# Patient Record
Sex: Male | Born: 1995 | Race: Asian | Hispanic: Yes | Marital: Single
Health system: Southern US, Community
[De-identification: ages and names within clinical notes are randomized; demographics above are authoritative.]

---

## 2018-08-21 ENCOUNTER — Emergency Department (HOSPITAL_COMMUNITY)
Admission: EM | Admit: 2018-08-21 | Discharge: 2018-08-21 | Disposition: A | Discharge status: Home / Self Care | Payer: BLUE CROSS/BLUE SHIELD | Attending: Emergency Medicine | Admitting: Emergency Medicine

## 2018-08-21 ENCOUNTER — Emergency Department (HOSPITAL_COMMUNITY): Payer: BLUE CROSS/BLUE SHIELD

## 2018-08-21 ENCOUNTER — Other Ambulatory Visit: Payer: Self-pay

## 2018-08-21 ENCOUNTER — Encounter (HOSPITAL_COMMUNITY): Payer: Self-pay | Admitting: Emergency Medicine

## 2018-08-21 DIAGNOSIS — Y9389 Activity, other specified: Secondary | ICD-10-CM | POA: Insufficient documentation

## 2018-08-21 DIAGNOSIS — Z23 Encounter for immunization: Secondary | ICD-10-CM | POA: Insufficient documentation

## 2018-08-21 DIAGNOSIS — S60552A Superficial foreign body of left hand, initial encounter: Secondary | ICD-10-CM | POA: Insufficient documentation

## 2018-08-21 DIAGNOSIS — S161XXA Strain of muscle, fascia and tendon at neck level, initial encounter: Secondary | ICD-10-CM

## 2018-08-21 DIAGNOSIS — M546 Pain in thoracic spine: Secondary | ICD-10-CM | POA: Insufficient documentation

## 2018-08-21 DIAGNOSIS — T148XXA Other injury of unspecified body region, initial encounter: Secondary | ICD-10-CM

## 2018-08-21 DIAGNOSIS — Y999 Unspecified external cause status: Secondary | ICD-10-CM | POA: Diagnosis not present

## 2018-08-21 DIAGNOSIS — Y929 Unspecified place or not applicable: Secondary | ICD-10-CM | POA: Diagnosis not present

## 2018-08-21 MED ORDER — CYCLOBENZAPRINE HCL 10 MG PO TABS: 10.0000 mg | ORAL_TABLET | Freq: Two times a day (BID) | ORAL | 0 refills | Status: AC | PRN

## 2018-08-21 MED ORDER — TETANUS-DIPHTH-ACELL PERTUSSIS 5-2.5-18.5 LF-MCG/0.5 IM SUSP
0.5000 mL | Freq: Once | INTRAMUSCULAR | Status: AC
  Administered 2018-08-21: 0.5 mL via INTRAMUSCULAR
  Filled 2018-08-21: qty 0.5

## 2018-08-21 NOTE — ED Notes (Signed)
Patient verbalized understanding of discharge instructions and denies any further needs or questions at this time. VS stable. Patient ambulatory with steady gait.  

## 2018-08-21 NOTE — Discharge Instructions (Signed)
Alternate 600 mg of ibuprofen and [phone] mg of Tylenol every 3 hours as needed for pain. Do not exceed 4000 mg of Tylenol daily.  Take ibuprofen with food to avoid upset stomach issues.  You may take Flexeril up to twice daily as needed for muscle spasms. This medication may make you drowsy, so I typically only recommended at night. If this medication makes you drowsy throughout the day, no driving, drinking alcohol, or operating heavy machinery. You may also cut these tablets in half. Ice to areas of soreness for the next few days and then may move to heat. Do some gentle stretching throughout the day, especially during hot showers or baths. Take short frequent walks and avoid prolonged periods of sitting or laying. Expect to be sore for the next few day and follow up with primary care physician for recheck of ongoing symptoms but return to ER for emergent changing or worsening of symptoms such as severe headache that gets worse, altered mental status/behaving unusually, persistent vomiting, excessive drowsiness, numbness to the arms or legs, unsteady gait, or slurred speech.

## 2018-08-21 NOTE — ED Notes (Signed)
PT transported to CT>
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
